# Patient Record
Sex: Female | Born: 1945 | Race: White | Hispanic: No | Marital: Single
Health system: Southern US, Community
[De-identification: ages and names within clinical notes are randomized; demographics above are authoritative.]

---

## 2015-11-06 DIAGNOSIS — R69 Illness, unspecified: Secondary | ICD-10-CM | POA: Diagnosis not present

## 2016-02-05 DIAGNOSIS — E7800 Pure hypercholesterolemia, unspecified: Secondary | ICD-10-CM | POA: Diagnosis not present

## 2016-02-05 DIAGNOSIS — R69 Illness, unspecified: Secondary | ICD-10-CM | POA: Diagnosis not present

## 2016-02-05 DIAGNOSIS — Z0000 Encounter for general adult medical examination without abnormal findings: Secondary | ICD-10-CM | POA: Diagnosis not present

## 2016-02-05 DIAGNOSIS — R17 Unspecified jaundice: Secondary | ICD-10-CM | POA: Diagnosis not present

## 2016-02-05 DIAGNOSIS — Z1389 Encounter for screening for other disorder: Secondary | ICD-10-CM | POA: Diagnosis not present

## 2016-02-12 DIAGNOSIS — H18821 Corneal disorder due to contact lens, right eye: Secondary | ICD-10-CM | POA: Diagnosis not present

## 2016-02-12 DIAGNOSIS — H16041 Marginal corneal ulcer, right eye: Secondary | ICD-10-CM | POA: Diagnosis not present

## 2016-02-13 DIAGNOSIS — R17 Unspecified jaundice: Secondary | ICD-10-CM | POA: Diagnosis not present

## 2016-02-14 DIAGNOSIS — H10413 Chronic giant papillary conjunctivitis, bilateral: Secondary | ICD-10-CM | POA: Diagnosis not present

## 2016-02-14 DIAGNOSIS — H17821 Peripheral opacity of cornea, right eye: Secondary | ICD-10-CM | POA: Diagnosis not present

## 2016-04-06 DIAGNOSIS — H11153 Pinguecula, bilateral: Secondary | ICD-10-CM | POA: Diagnosis not present

## 2016-04-06 DIAGNOSIS — H2513 Age-related nuclear cataract, bilateral: Secondary | ICD-10-CM | POA: Diagnosis not present

## 2016-04-13 DIAGNOSIS — Z1231 Encounter for screening mammogram for malignant neoplasm of breast: Secondary | ICD-10-CM | POA: Diagnosis not present

## 2016-07-29 DIAGNOSIS — D692 Other nonthrombocytopenic purpura: Secondary | ICD-10-CM | POA: Diagnosis not present

## 2016-07-29 DIAGNOSIS — D0462 Carcinoma in situ of skin of left upper limb, including shoulder: Secondary | ICD-10-CM | POA: Diagnosis not present

## 2016-07-29 DIAGNOSIS — Z85828 Personal history of other malignant neoplasm of skin: Secondary | ICD-10-CM | POA: Diagnosis not present

## 2016-07-29 DIAGNOSIS — L738 Other specified follicular disorders: Secondary | ICD-10-CM | POA: Diagnosis not present

## 2016-07-29 DIAGNOSIS — D2239 Melanocytic nevi of other parts of face: Secondary | ICD-10-CM | POA: Diagnosis not present

## 2016-07-29 DIAGNOSIS — L570 Actinic keratosis: Secondary | ICD-10-CM | POA: Diagnosis not present

## 2016-07-29 DIAGNOSIS — L821 Other seborrheic keratosis: Secondary | ICD-10-CM | POA: Diagnosis not present

## 2016-08-05 DIAGNOSIS — D0462 Carcinoma in situ of skin of left upper limb, including shoulder: Secondary | ICD-10-CM | POA: Diagnosis not present

## 2016-08-05 DIAGNOSIS — Z23 Encounter for immunization: Secondary | ICD-10-CM | POA: Diagnosis not present

## 2016-08-05 DIAGNOSIS — Z85828 Personal history of other malignant neoplasm of skin: Secondary | ICD-10-CM | POA: Diagnosis not present

## 2016-11-27 DIAGNOSIS — R509 Fever, unspecified: Secondary | ICD-10-CM | POA: Diagnosis not present

## 2016-11-27 DIAGNOSIS — J101 Influenza due to other identified influenza virus with other respiratory manifestations: Secondary | ICD-10-CM | POA: Diagnosis not present

## 2016-12-14 DIAGNOSIS — R69 Illness, unspecified: Secondary | ICD-10-CM | POA: Diagnosis not present

## 2017-03-28 DIAGNOSIS — Z0100 Encounter for examination of eyes and vision without abnormal findings: Secondary | ICD-10-CM | POA: Diagnosis not present

## 2017-03-31 DIAGNOSIS — Z1231 Encounter for screening mammogram for malignant neoplasm of breast: Secondary | ICD-10-CM | POA: Diagnosis not present

## 2017-04-07 DIAGNOSIS — H11153 Pinguecula, bilateral: Secondary | ICD-10-CM | POA: Diagnosis not present

## 2017-04-07 DIAGNOSIS — H2513 Age-related nuclear cataract, bilateral: Secondary | ICD-10-CM | POA: Diagnosis not present

## 2017-05-11 DIAGNOSIS — Z1389 Encounter for screening for other disorder: Secondary | ICD-10-CM | POA: Diagnosis not present

## 2017-05-11 DIAGNOSIS — M8580 Other specified disorders of bone density and structure, unspecified site: Secondary | ICD-10-CM | POA: Diagnosis not present

## 2017-05-11 DIAGNOSIS — E7800 Pure hypercholesterolemia, unspecified: Secondary | ICD-10-CM | POA: Diagnosis not present

## 2017-05-11 DIAGNOSIS — Z0000 Encounter for general adult medical examination without abnormal findings: Secondary | ICD-10-CM | POA: Diagnosis not present

## 2017-06-16 DIAGNOSIS — L821 Other seborrheic keratosis: Secondary | ICD-10-CM | POA: Diagnosis not present

## 2017-06-16 DIAGNOSIS — L814 Other melanin hyperpigmentation: Secondary | ICD-10-CM | POA: Diagnosis not present

## 2017-06-16 DIAGNOSIS — L738 Other specified follicular disorders: Secondary | ICD-10-CM | POA: Diagnosis not present

## 2017-06-16 DIAGNOSIS — D2239 Melanocytic nevi of other parts of face: Secondary | ICD-10-CM | POA: Diagnosis not present

## 2017-06-16 DIAGNOSIS — D2271 Melanocytic nevi of right lower limb, including hip: Secondary | ICD-10-CM | POA: Diagnosis not present

## 2017-06-16 DIAGNOSIS — D692 Other nonthrombocytopenic purpura: Secondary | ICD-10-CM | POA: Diagnosis not present

## 2017-06-16 DIAGNOSIS — Z85828 Personal history of other malignant neoplasm of skin: Secondary | ICD-10-CM | POA: Diagnosis not present

## 2017-07-15 DIAGNOSIS — M8588 Other specified disorders of bone density and structure, other site: Secondary | ICD-10-CM | POA: Diagnosis not present

## 2017-08-12 DIAGNOSIS — Z23 Encounter for immunization: Secondary | ICD-10-CM | POA: Diagnosis not present

## 2017-12-03 DIAGNOSIS — Z0100 Encounter for examination of eyes and vision without abnormal findings: Secondary | ICD-10-CM | POA: Diagnosis not present

## 2018-02-16 DIAGNOSIS — R69 Illness, unspecified: Secondary | ICD-10-CM | POA: Diagnosis not present

## 2018-03-01 DIAGNOSIS — R69 Illness, unspecified: Secondary | ICD-10-CM | POA: Diagnosis not present

## 2018-04-07 DIAGNOSIS — H17821 Peripheral opacity of cornea, right eye: Secondary | ICD-10-CM | POA: Diagnosis not present

## 2018-04-07 DIAGNOSIS — H35372 Puckering of macula, left eye: Secondary | ICD-10-CM | POA: Diagnosis not present

## 2018-04-07 DIAGNOSIS — Z1231 Encounter for screening mammogram for malignant neoplasm of breast: Secondary | ICD-10-CM | POA: Diagnosis not present

## 2018-04-07 DIAGNOSIS — H2513 Age-related nuclear cataract, bilateral: Secondary | ICD-10-CM | POA: Diagnosis not present

## 2018-06-03 DIAGNOSIS — Z1389 Encounter for screening for other disorder: Secondary | ICD-10-CM | POA: Diagnosis not present

## 2018-06-03 DIAGNOSIS — M8580 Other specified disorders of bone density and structure, unspecified site: Secondary | ICD-10-CM | POA: Diagnosis not present

## 2018-06-03 DIAGNOSIS — E7800 Pure hypercholesterolemia, unspecified: Secondary | ICD-10-CM | POA: Diagnosis not present

## 2018-06-03 DIAGNOSIS — Z0000 Encounter for general adult medical examination without abnormal findings: Secondary | ICD-10-CM | POA: Diagnosis not present

## 2018-07-26 DIAGNOSIS — D1801 Hemangioma of skin and subcutaneous tissue: Secondary | ICD-10-CM | POA: Diagnosis not present

## 2018-07-26 DIAGNOSIS — L570 Actinic keratosis: Secondary | ICD-10-CM | POA: Diagnosis not present

## 2018-07-26 DIAGNOSIS — L821 Other seborrheic keratosis: Secondary | ICD-10-CM | POA: Diagnosis not present

## 2018-07-26 DIAGNOSIS — Z85828 Personal history of other malignant neoplasm of skin: Secondary | ICD-10-CM | POA: Diagnosis not present

## 2018-07-26 DIAGNOSIS — L738 Other specified follicular disorders: Secondary | ICD-10-CM | POA: Diagnosis not present

## 2018-07-28 DIAGNOSIS — Z23 Encounter for immunization: Secondary | ICD-10-CM | POA: Diagnosis not present

## 2018-08-24 DIAGNOSIS — R69 Illness, unspecified: Secondary | ICD-10-CM | POA: Diagnosis not present

## 2018-11-22 DIAGNOSIS — Z0100 Encounter for examination of eyes and vision without abnormal findings: Secondary | ICD-10-CM | POA: Diagnosis not present

## 2019-02-28 DIAGNOSIS — R69 Illness, unspecified: Secondary | ICD-10-CM | POA: Diagnosis not present

## 2019-04-10 DIAGNOSIS — H5203 Hypermetropia, bilateral: Secondary | ICD-10-CM | POA: Diagnosis not present

## 2019-04-10 DIAGNOSIS — H2513 Age-related nuclear cataract, bilateral: Secondary | ICD-10-CM | POA: Diagnosis not present

## 2019-04-10 DIAGNOSIS — H1789 Other corneal scars and opacities: Secondary | ICD-10-CM | POA: Diagnosis not present

## 2019-04-13 DIAGNOSIS — Z1231 Encounter for screening mammogram for malignant neoplasm of breast: Secondary | ICD-10-CM | POA: Diagnosis not present

## 2019-06-13 DIAGNOSIS — M8580 Other specified disorders of bone density and structure, unspecified site: Secondary | ICD-10-CM | POA: Diagnosis not present

## 2019-06-13 DIAGNOSIS — E782 Mixed hyperlipidemia: Secondary | ICD-10-CM | POA: Diagnosis not present

## 2019-06-13 DIAGNOSIS — Z1389 Encounter for screening for other disorder: Secondary | ICD-10-CM | POA: Diagnosis not present

## 2019-06-13 DIAGNOSIS — Z0000 Encounter for general adult medical examination without abnormal findings: Secondary | ICD-10-CM | POA: Diagnosis not present

## 2019-06-28 ENCOUNTER — Other Ambulatory Visit: Payer: Self-pay | Admitting: Internal Medicine

## 2019-06-28 DIAGNOSIS — M8580 Other specified disorders of bone density and structure, unspecified site: Secondary | ICD-10-CM

## 2019-07-26 DIAGNOSIS — Z23 Encounter for immunization: Secondary | ICD-10-CM | POA: Diagnosis not present

## 2019-07-27 ENCOUNTER — Ambulatory Visit
Admission: RE | Admit: 2019-07-27 | Discharge: 2019-07-27 | Disposition: A | Discharge status: Home / Self Care | Payer: Medicare HMO | Source: Ambulatory Visit | Attending: Internal Medicine | Admitting: Internal Medicine

## 2019-07-27 ENCOUNTER — Other Ambulatory Visit: Payer: Self-pay

## 2019-07-27 DIAGNOSIS — M8580 Other specified disorders of bone density and structure, unspecified site: Secondary | ICD-10-CM

## 2019-07-27 DIAGNOSIS — Z780 Asymptomatic menopausal state: Secondary | ICD-10-CM | POA: Diagnosis not present

## 2019-07-27 DIAGNOSIS — M85852 Other specified disorders of bone density and structure, left thigh: Secondary | ICD-10-CM | POA: Diagnosis not present

## 2019-09-25 DIAGNOSIS — R69 Illness, unspecified: Secondary | ICD-10-CM | POA: Diagnosis not present

## 2019-10-03 DIAGNOSIS — L821 Other seborrheic keratosis: Secondary | ICD-10-CM | POA: Diagnosis not present

## 2019-10-03 DIAGNOSIS — L814 Other melanin hyperpigmentation: Secondary | ICD-10-CM | POA: Diagnosis not present

## 2019-10-03 DIAGNOSIS — D2239 Melanocytic nevi of other parts of face: Secondary | ICD-10-CM | POA: Diagnosis not present

## 2019-10-03 DIAGNOSIS — L905 Scar conditions and fibrosis of skin: Secondary | ICD-10-CM | POA: Diagnosis not present

## 2019-10-03 DIAGNOSIS — Z85828 Personal history of other malignant neoplasm of skin: Secondary | ICD-10-CM | POA: Diagnosis not present

## 2019-10-03 DIAGNOSIS — L738 Other specified follicular disorders: Secondary | ICD-10-CM | POA: Diagnosis not present

## 2019-10-03 DIAGNOSIS — L570 Actinic keratosis: Secondary | ICD-10-CM | POA: Diagnosis not present

## 2019-11-04 ENCOUNTER — Ambulatory Visit: Payer: Medicare HMO | Attending: Internal Medicine

## 2019-11-04 NOTE — Progress Notes (Unsigned)
   Covid-19 Vaccination Clinic  Name:  Destiny Duncan    MRN: [id number] DOB: 05/28/1946  11/04/2019  Destiny Duncan was observed post Covid-19 immunization for {COVID Vaccine Observation Times:23551} without incidence. She was provided with Vaccine Information Sheet and instruction to access the V-Safe system.   Destiny Duncan was instructed to call 911 with any severe reactions post vaccine: Marland Kitchen Difficulty breathing  . Swelling of your face and throat  . A fast heartbeat  . A bad rash all over your body  . Dizziness and weakness

## 2019-11-22 ENCOUNTER — Ambulatory Visit: Payer: Medicare HMO | Attending: Internal Medicine

## 2019-11-22 ENCOUNTER — Ambulatory Visit: Payer: Self-pay

## 2019-11-22 DIAGNOSIS — Z23 Encounter for immunization: Secondary | ICD-10-CM | POA: Insufficient documentation

## 2019-11-22 NOTE — Progress Notes (Signed)
   Covid-19 Vaccination Clinic  Name:  Destiny Duncan    MRN: [id number] DOB: 1946-09-19  11/22/2019  Destiny Duncan was observed post Covid-19 immunization for 15 minutes without incidence. She was provided with Vaccine Information Sheet and instruction to access the V-Safe system.   Destiny Duncan was instructed to call 911 with any severe reactions post vaccine: Marland Kitchen Difficulty breathing  . Swelling of your face and throat  . A fast heartbeat  . A bad rash all over your body  . Dizziness and weakness    Immunizations Administered    Name Date Dose VIS Date Route   Pfizer COVID-19 Vaccine 11/22/2019  8:16 AM 0.3 mL 09/29/2019 Intramuscular   Manufacturer: [place]   Lot: [id number]   [place]: [id number]

## 2020-03-22 DIAGNOSIS — K130 Diseases of lips: Secondary | ICD-10-CM | POA: Diagnosis not present

## 2020-03-22 DIAGNOSIS — Z85828 Personal history of other malignant neoplasm of skin: Secondary | ICD-10-CM | POA: Diagnosis not present

## 2020-03-27 DIAGNOSIS — R69 Illness, unspecified: Secondary | ICD-10-CM | POA: Diagnosis not present

## 2020-04-17 DIAGNOSIS — Z1231 Encounter for screening mammogram for malignant neoplasm of breast: Secondary | ICD-10-CM | POA: Diagnosis not present

## 2020-05-03 DIAGNOSIS — L821 Other seborrheic keratosis: Secondary | ICD-10-CM | POA: Diagnosis not present

## 2020-05-03 DIAGNOSIS — Z85828 Personal history of other malignant neoplasm of skin: Secondary | ICD-10-CM | POA: Diagnosis not present

## 2020-05-03 DIAGNOSIS — K130 Diseases of lips: Secondary | ICD-10-CM | POA: Diagnosis not present

## 2020-06-21 DIAGNOSIS — E7800 Pure hypercholesterolemia, unspecified: Secondary | ICD-10-CM | POA: Diagnosis not present

## 2020-06-21 DIAGNOSIS — Z79899 Other long term (current) drug therapy: Secondary | ICD-10-CM | POA: Diagnosis not present

## 2020-06-21 DIAGNOSIS — Z1389 Encounter for screening for other disorder: Secondary | ICD-10-CM | POA: Diagnosis not present

## 2020-06-21 DIAGNOSIS — M8580 Other specified disorders of bone density and structure, unspecified site: Secondary | ICD-10-CM | POA: Diagnosis not present

## 2020-06-21 DIAGNOSIS — Z0000 Encounter for general adult medical examination without abnormal findings: Secondary | ICD-10-CM | POA: Diagnosis not present

## 2020-06-25 DIAGNOSIS — Z0100 Encounter for examination of eyes and vision without abnormal findings: Secondary | ICD-10-CM | POA: Diagnosis not present

## 2020-06-25 DIAGNOSIS — H524 Presbyopia: Secondary | ICD-10-CM | POA: Diagnosis not present

## 2020-06-25 DIAGNOSIS — H2513 Age-related nuclear cataract, bilateral: Secondary | ICD-10-CM | POA: Diagnosis not present

## 2020-07-10 DIAGNOSIS — R69 Illness, unspecified: Secondary | ICD-10-CM | POA: Diagnosis not present

## 2020-07-25 DIAGNOSIS — Z23 Encounter for immunization: Secondary | ICD-10-CM | POA: Diagnosis not present

## 2020-08-31 DIAGNOSIS — R69 Illness, unspecified: Secondary | ICD-10-CM | POA: Diagnosis not present

## 2020-09-30 DIAGNOSIS — R69 Illness, unspecified: Secondary | ICD-10-CM | POA: Diagnosis not present

## 2020-10-22 DIAGNOSIS — L738 Other specified follicular disorders: Secondary | ICD-10-CM | POA: Diagnosis not present

## 2020-10-22 DIAGNOSIS — L821 Other seborrheic keratosis: Secondary | ICD-10-CM | POA: Diagnosis not present

## 2020-10-22 DIAGNOSIS — L820 Inflamed seborrheic keratosis: Secondary | ICD-10-CM | POA: Diagnosis not present

## 2020-10-22 DIAGNOSIS — D1801 Hemangioma of skin and subcutaneous tissue: Secondary | ICD-10-CM | POA: Diagnosis not present

## 2020-10-22 DIAGNOSIS — L570 Actinic keratosis: Secondary | ICD-10-CM | POA: Diagnosis not present

## 2020-10-22 DIAGNOSIS — Z85828 Personal history of other malignant neoplasm of skin: Secondary | ICD-10-CM | POA: Diagnosis not present

## 2021-04-26 DIAGNOSIS — Z1231 Encounter for screening mammogram for malignant neoplasm of breast: Secondary | ICD-10-CM | POA: Diagnosis not present

## 2021-06-26 DIAGNOSIS — H2513 Age-related nuclear cataract, bilateral: Secondary | ICD-10-CM | POA: Diagnosis not present

## 2021-06-26 DIAGNOSIS — H524 Presbyopia: Secondary | ICD-10-CM | POA: Diagnosis not present

## 2021-06-27 DIAGNOSIS — Z0100 Encounter for examination of eyes and vision without abnormal findings: Secondary | ICD-10-CM | POA: Diagnosis not present

## 2021-07-04 DIAGNOSIS — E7800 Pure hypercholesterolemia, unspecified: Secondary | ICD-10-CM | POA: Diagnosis not present

## 2021-07-04 DIAGNOSIS — Z1211 Encounter for screening for malignant neoplasm of colon: Secondary | ICD-10-CM | POA: Diagnosis not present

## 2021-07-04 DIAGNOSIS — Z1389 Encounter for screening for other disorder: Secondary | ICD-10-CM | POA: Diagnosis not present

## 2021-07-04 DIAGNOSIS — M8580 Other specified disorders of bone density and structure, unspecified site: Secondary | ICD-10-CM | POA: Diagnosis not present

## 2021-07-04 DIAGNOSIS — Z0000 Encounter for general adult medical examination without abnormal findings: Secondary | ICD-10-CM | POA: Diagnosis not present

## 2021-07-04 DIAGNOSIS — Z1331 Encounter for screening for depression: Secondary | ICD-10-CM | POA: Diagnosis not present

## 2021-07-07 DIAGNOSIS — Z1211 Encounter for screening for malignant neoplasm of colon: Secondary | ICD-10-CM | POA: Diagnosis not present

## 2021-07-08 ENCOUNTER — Other Ambulatory Visit: Payer: Self-pay | Admitting: Internal Medicine

## 2021-07-08 DIAGNOSIS — M8580 Other specified disorders of bone density and structure, unspecified site: Secondary | ICD-10-CM

## 2021-08-07 DIAGNOSIS — R030 Elevated blood-pressure reading, without diagnosis of hypertension: Secondary | ICD-10-CM | POA: Diagnosis not present

## 2021-08-07 DIAGNOSIS — Z87891 Personal history of nicotine dependence: Secondary | ICD-10-CM | POA: Diagnosis not present

## 2021-08-07 DIAGNOSIS — Z8249 Family history of ischemic heart disease and other diseases of the circulatory system: Secondary | ICD-10-CM | POA: Diagnosis not present

## 2021-08-07 DIAGNOSIS — M1990 Unspecified osteoarthritis, unspecified site: Secondary | ICD-10-CM | POA: Diagnosis not present

## 2021-08-07 DIAGNOSIS — Z818 Family history of other mental and behavioral disorders: Secondary | ICD-10-CM | POA: Diagnosis not present

## 2021-08-07 DIAGNOSIS — R69 Illness, unspecified: Secondary | ICD-10-CM | POA: Diagnosis not present

## 2021-08-07 DIAGNOSIS — Z809 Family history of malignant neoplasm, unspecified: Secondary | ICD-10-CM | POA: Diagnosis not present

## 2021-11-17 DIAGNOSIS — L821 Other seborrheic keratosis: Secondary | ICD-10-CM | POA: Diagnosis not present

## 2021-11-17 DIAGNOSIS — D692 Other nonthrombocytopenic purpura: Secondary | ICD-10-CM | POA: Diagnosis not present

## 2021-11-17 DIAGNOSIS — L570 Actinic keratosis: Secondary | ICD-10-CM | POA: Diagnosis not present

## 2021-11-17 DIAGNOSIS — Z85828 Personal history of other malignant neoplasm of skin: Secondary | ICD-10-CM | POA: Diagnosis not present

## 2021-11-17 DIAGNOSIS — L738 Other specified follicular disorders: Secondary | ICD-10-CM | POA: Diagnosis not present

## 2021-12-19 ENCOUNTER — Other Ambulatory Visit: Payer: Medicare HMO

## 2021-12-26 DIAGNOSIS — H524 Presbyopia: Secondary | ICD-10-CM | POA: Diagnosis not present

## 2021-12-26 DIAGNOSIS — H2513 Age-related nuclear cataract, bilateral: Secondary | ICD-10-CM | POA: Diagnosis not present

## 2022-03-23 DIAGNOSIS — Z87891 Personal history of nicotine dependence: Secondary | ICD-10-CM | POA: Diagnosis not present

## 2022-03-23 DIAGNOSIS — M1990 Unspecified osteoarthritis, unspecified site: Secondary | ICD-10-CM | POA: Diagnosis not present

## 2022-03-23 DIAGNOSIS — R030 Elevated blood-pressure reading, without diagnosis of hypertension: Secondary | ICD-10-CM | POA: Diagnosis not present

## 2022-03-23 DIAGNOSIS — Z85828 Personal history of other malignant neoplasm of skin: Secondary | ICD-10-CM | POA: Diagnosis not present

## 2022-03-23 DIAGNOSIS — Z809 Family history of malignant neoplasm, unspecified: Secondary | ICD-10-CM | POA: Diagnosis not present

## 2022-03-23 DIAGNOSIS — R32 Unspecified urinary incontinence: Secondary | ICD-10-CM | POA: Diagnosis not present

## 2022-03-25 ENCOUNTER — Ambulatory Visit
Admission: RE | Admit: 2022-03-25 | Discharge: 2022-03-25 | Disposition: A | Discharge status: Home / Self Care | Payer: Medicare HMO | Source: Ambulatory Visit | Attending: Internal Medicine | Admitting: Internal Medicine

## 2022-03-25 DIAGNOSIS — Z780 Asymptomatic menopausal state: Secondary | ICD-10-CM | POA: Diagnosis not present

## 2022-03-25 DIAGNOSIS — M810 Age-related osteoporosis without current pathological fracture: Secondary | ICD-10-CM | POA: Diagnosis not present

## 2022-03-25 DIAGNOSIS — M85852 Other specified disorders of bone density and structure, left thigh: Secondary | ICD-10-CM | POA: Diagnosis not present

## 2022-03-25 DIAGNOSIS — M8580 Other specified disorders of bone density and structure, unspecified site: Secondary | ICD-10-CM

## 2022-05-14 DIAGNOSIS — Z1231 Encounter for screening mammogram for malignant neoplasm of breast: Secondary | ICD-10-CM | POA: Diagnosis not present

## 2022-08-03 DIAGNOSIS — H2513 Age-related nuclear cataract, bilateral: Secondary | ICD-10-CM | POA: Diagnosis not present

## 2022-08-03 DIAGNOSIS — H5203 Hypermetropia, bilateral: Secondary | ICD-10-CM | POA: Diagnosis not present

## 2022-08-12 DIAGNOSIS — H269 Unspecified cataract: Secondary | ICD-10-CM | POA: Diagnosis not present

## 2022-08-12 DIAGNOSIS — H2512 Age-related nuclear cataract, left eye: Secondary | ICD-10-CM | POA: Diagnosis not present

## 2022-08-13 DIAGNOSIS — M810 Age-related osteoporosis without current pathological fracture: Secondary | ICD-10-CM | POA: Diagnosis not present

## 2022-08-13 DIAGNOSIS — E7800 Pure hypercholesterolemia, unspecified: Secondary | ICD-10-CM | POA: Diagnosis not present

## 2022-08-13 DIAGNOSIS — Z79899 Other long term (current) drug therapy: Secondary | ICD-10-CM | POA: Diagnosis not present

## 2022-08-13 DIAGNOSIS — Z1331 Encounter for screening for depression: Secondary | ICD-10-CM | POA: Diagnosis not present

## 2022-08-13 DIAGNOSIS — Z5181 Encounter for therapeutic drug level monitoring: Secondary | ICD-10-CM | POA: Diagnosis not present

## 2022-08-13 DIAGNOSIS — Z0000 Encounter for general adult medical examination without abnormal findings: Secondary | ICD-10-CM | POA: Diagnosis not present

## 2022-08-26 DIAGNOSIS — H269 Unspecified cataract: Secondary | ICD-10-CM | POA: Diagnosis not present

## 2022-08-26 DIAGNOSIS — H2511 Age-related nuclear cataract, right eye: Secondary | ICD-10-CM | POA: Diagnosis not present

## 2022-10-21 DIAGNOSIS — R69 Illness, unspecified: Secondary | ICD-10-CM | POA: Diagnosis not present

## 2022-11-10 DIAGNOSIS — R69 Illness, unspecified: Secondary | ICD-10-CM | POA: Diagnosis not present

## 2022-12-04 DIAGNOSIS — R69 Illness, unspecified: Secondary | ICD-10-CM | POA: Diagnosis not present

## 2022-12-08 DIAGNOSIS — R69 Illness, unspecified: Secondary | ICD-10-CM | POA: Diagnosis not present

## 2022-12-29 DIAGNOSIS — R69 Illness, unspecified: Secondary | ICD-10-CM | POA: Diagnosis not present

## 2022-12-30 DIAGNOSIS — L821 Other seborrheic keratosis: Secondary | ICD-10-CM | POA: Diagnosis not present

## 2022-12-30 DIAGNOSIS — R69 Illness, unspecified: Secondary | ICD-10-CM | POA: Diagnosis not present

## 2022-12-30 DIAGNOSIS — L817 Pigmented purpuric dermatosis: Secondary | ICD-10-CM | POA: Diagnosis not present

## 2022-12-30 DIAGNOSIS — D692 Other nonthrombocytopenic purpura: Secondary | ICD-10-CM | POA: Diagnosis not present

## 2022-12-30 DIAGNOSIS — L570 Actinic keratosis: Secondary | ICD-10-CM | POA: Diagnosis not present

## 2022-12-30 DIAGNOSIS — L738 Other specified follicular disorders: Secondary | ICD-10-CM | POA: Diagnosis not present

## 2022-12-30 DIAGNOSIS — D485 Neoplasm of uncertain behavior of skin: Secondary | ICD-10-CM | POA: Diagnosis not present

## 2022-12-30 DIAGNOSIS — Z85828 Personal history of other malignant neoplasm of skin: Secondary | ICD-10-CM | POA: Diagnosis not present

## 2023-01-31 DIAGNOSIS — R69 Illness, unspecified: Secondary | ICD-10-CM | POA: Diagnosis not present

## 2023-03-04 DIAGNOSIS — R69 Illness, unspecified: Secondary | ICD-10-CM | POA: Diagnosis not present

## 2023-03-12 DIAGNOSIS — R69 Illness, unspecified: Secondary | ICD-10-CM | POA: Diagnosis not present

## 2023-03-26 DIAGNOSIS — R69 Illness, unspecified: Secondary | ICD-10-CM | POA: Diagnosis not present

## 2023-04-02 DIAGNOSIS — R69 Illness, unspecified: Secondary | ICD-10-CM | POA: Diagnosis not present

## 2023-04-06 DIAGNOSIS — R69 Illness, unspecified: Secondary | ICD-10-CM | POA: Diagnosis not present

## 2023-04-07 DIAGNOSIS — R69 Illness, unspecified: Secondary | ICD-10-CM | POA: Diagnosis not present

## 2023-04-13 DIAGNOSIS — R69 Illness, unspecified: Secondary | ICD-10-CM | POA: Diagnosis not present

## 2023-04-16 DIAGNOSIS — R69 Illness, unspecified: Secondary | ICD-10-CM | POA: Diagnosis not present

## 2023-04-17 DIAGNOSIS — R69 Illness, unspecified: Secondary | ICD-10-CM | POA: Diagnosis not present

## 2023-04-18 DIAGNOSIS — R69 Illness, unspecified: Secondary | ICD-10-CM | POA: Diagnosis not present

## 2023-05-04 DIAGNOSIS — Z961 Presence of intraocular lens: Secondary | ICD-10-CM | POA: Diagnosis not present

## 2023-05-20 DIAGNOSIS — Z1231 Encounter for screening mammogram for malignant neoplasm of breast: Secondary | ICD-10-CM | POA: Diagnosis not present

## 2023-06-14 DIAGNOSIS — R32 Unspecified urinary incontinence: Secondary | ICD-10-CM | POA: Diagnosis not present

## 2023-06-14 DIAGNOSIS — Z85828 Personal history of other malignant neoplasm of skin: Secondary | ICD-10-CM | POA: Diagnosis not present

## 2023-06-14 DIAGNOSIS — Z809 Family history of malignant neoplasm, unspecified: Secondary | ICD-10-CM | POA: Diagnosis not present

## 2023-06-14 DIAGNOSIS — M810 Age-related osteoporosis without current pathological fracture: Secondary | ICD-10-CM | POA: Diagnosis not present

## 2023-06-14 DIAGNOSIS — Z8249 Family history of ischemic heart disease and other diseases of the circulatory system: Secondary | ICD-10-CM | POA: Diagnosis not present

## 2023-06-14 DIAGNOSIS — Z87891 Personal history of nicotine dependence: Secondary | ICD-10-CM | POA: Diagnosis not present

## 2023-06-14 DIAGNOSIS — Z7983 Long term (current) use of bisphosphonates: Secondary | ICD-10-CM | POA: Diagnosis not present

## 2023-06-14 DIAGNOSIS — M1990 Unspecified osteoarthritis, unspecified site: Secondary | ICD-10-CM | POA: Diagnosis not present

## 2023-06-14 DIAGNOSIS — I10 Essential (primary) hypertension: Secondary | ICD-10-CM | POA: Diagnosis not present

## 2023-06-14 DIAGNOSIS — Z008 Encounter for other general examination: Secondary | ICD-10-CM | POA: Diagnosis not present

## 2023-06-14 DIAGNOSIS — Z66 Do not resuscitate: Secondary | ICD-10-CM | POA: Diagnosis not present

## 2023-06-14 DIAGNOSIS — E785 Hyperlipidemia, unspecified: Secondary | ICD-10-CM | POA: Diagnosis not present

## 2023-06-14 DIAGNOSIS — Z818 Family history of other mental and behavioral disorders: Secondary | ICD-10-CM | POA: Diagnosis not present

## 2023-08-17 DIAGNOSIS — Z5181 Encounter for therapeutic drug level monitoring: Secondary | ICD-10-CM | POA: Diagnosis not present

## 2023-08-17 DIAGNOSIS — Z0000 Encounter for general adult medical examination without abnormal findings: Secondary | ICD-10-CM | POA: Diagnosis not present

## 2023-08-17 DIAGNOSIS — M810 Age-related osteoporosis without current pathological fracture: Secondary | ICD-10-CM | POA: Diagnosis not present

## 2023-08-17 DIAGNOSIS — Z1331 Encounter for screening for depression: Secondary | ICD-10-CM | POA: Diagnosis not present

## 2023-08-17 DIAGNOSIS — E7800 Pure hypercholesterolemia, unspecified: Secondary | ICD-10-CM | POA: Diagnosis not present

## 2023-11-23 DIAGNOSIS — L821 Other seborrheic keratosis: Secondary | ICD-10-CM | POA: Diagnosis not present

## 2023-11-23 DIAGNOSIS — L570 Actinic keratosis: Secondary | ICD-10-CM | POA: Diagnosis not present

## 2023-11-23 DIAGNOSIS — I8312 Varicose veins of left lower extremity with inflammation: Secondary | ICD-10-CM | POA: Diagnosis not present

## 2023-11-23 DIAGNOSIS — I872 Venous insufficiency (chronic) (peripheral): Secondary | ICD-10-CM | POA: Diagnosis not present

## 2023-11-23 DIAGNOSIS — I8311 Varicose veins of right lower extremity with inflammation: Secondary | ICD-10-CM | POA: Diagnosis not present

## 2023-11-23 DIAGNOSIS — L738 Other specified follicular disorders: Secondary | ICD-10-CM | POA: Diagnosis not present

## 2023-11-23 DIAGNOSIS — Z85828 Personal history of other malignant neoplasm of skin: Secondary | ICD-10-CM | POA: Diagnosis not present

## 2023-12-29 DIAGNOSIS — M79673 Pain in unspecified foot: Secondary | ICD-10-CM | POA: Diagnosis not present

## 2024-02-16 DIAGNOSIS — L821 Other seborrheic keratosis: Secondary | ICD-10-CM | POA: Diagnosis not present

## 2024-02-16 DIAGNOSIS — Z85828 Personal history of other malignant neoplasm of skin: Secondary | ICD-10-CM | POA: Diagnosis not present

## 2024-02-16 DIAGNOSIS — I872 Venous insufficiency (chronic) (peripheral): Secondary | ICD-10-CM | POA: Diagnosis not present

## 2024-02-16 DIAGNOSIS — L820 Inflamed seborrheic keratosis: Secondary | ICD-10-CM | POA: Diagnosis not present

## 2024-02-16 DIAGNOSIS — D485 Neoplasm of uncertain behavior of skin: Secondary | ICD-10-CM | POA: Diagnosis not present

## 2024-02-16 DIAGNOSIS — I8311 Varicose veins of right lower extremity with inflammation: Secondary | ICD-10-CM | POA: Diagnosis not present

## 2024-02-16 DIAGNOSIS — I8312 Varicose veins of left lower extremity with inflammation: Secondary | ICD-10-CM | POA: Diagnosis not present

## 2024-02-16 DIAGNOSIS — C44722 Squamous cell carcinoma of skin of right lower limb, including hip: Secondary | ICD-10-CM | POA: Diagnosis not present

## 2024-03-14 DIAGNOSIS — H0015 Chalazion left lower eyelid: Secondary | ICD-10-CM | POA: Diagnosis not present

## 2024-03-14 DIAGNOSIS — H0012 Chalazion right lower eyelid: Secondary | ICD-10-CM | POA: Diagnosis not present

## 2024-04-17 DIAGNOSIS — E7800 Pure hypercholesterolemia, unspecified: Secondary | ICD-10-CM | POA: Diagnosis not present

## 2024-04-17 DIAGNOSIS — M810 Age-related osteoporosis without current pathological fracture: Secondary | ICD-10-CM | POA: Diagnosis not present

## 2024-05-18 DIAGNOSIS — E7800 Pure hypercholesterolemia, unspecified: Secondary | ICD-10-CM | POA: Diagnosis not present

## 2024-05-18 DIAGNOSIS — M810 Age-related osteoporosis without current pathological fracture: Secondary | ICD-10-CM | POA: Diagnosis not present

## 2024-05-25 DIAGNOSIS — Z1231 Encounter for screening mammogram for malignant neoplasm of breast: Secondary | ICD-10-CM | POA: Diagnosis not present

## 2024-06-15 DIAGNOSIS — H1789 Other corneal scars and opacities: Secondary | ICD-10-CM | POA: Diagnosis not present

## 2024-06-15 DIAGNOSIS — Z961 Presence of intraocular lens: Secondary | ICD-10-CM | POA: Diagnosis not present

## 2024-06-15 DIAGNOSIS — H04123 Dry eye syndrome of bilateral lacrimal glands: Secondary | ICD-10-CM | POA: Diagnosis not present

## 2024-08-21 DIAGNOSIS — Z23 Encounter for immunization: Secondary | ICD-10-CM | POA: Diagnosis not present

## 2024-08-21 DIAGNOSIS — Z5181 Encounter for therapeutic drug level monitoring: Secondary | ICD-10-CM | POA: Diagnosis not present

## 2024-08-21 DIAGNOSIS — Z0000 Encounter for general adult medical examination without abnormal findings: Secondary | ICD-10-CM | POA: Diagnosis not present

## 2024-08-21 DIAGNOSIS — E7800 Pure hypercholesterolemia, unspecified: Secondary | ICD-10-CM | POA: Diagnosis not present

## 2024-08-21 DIAGNOSIS — Z1331 Encounter for screening for depression: Secondary | ICD-10-CM | POA: Diagnosis not present

## 2024-10-03 DIAGNOSIS — M85852 Other specified disorders of bone density and structure, left thigh: Secondary | ICD-10-CM | POA: Diagnosis not present

## 2024-10-03 DIAGNOSIS — M85851 Other specified disorders of bone density and structure, right thigh: Secondary | ICD-10-CM | POA: Diagnosis not present

## 2024-10-11 DIAGNOSIS — R059 Cough, unspecified: Secondary | ICD-10-CM | POA: Diagnosis not present

## 2024-10-11 DIAGNOSIS — J069 Acute upper respiratory infection, unspecified: Secondary | ICD-10-CM | POA: Diagnosis not present

## 2024-10-11 DIAGNOSIS — R509 Fever, unspecified: Secondary | ICD-10-CM | POA: Diagnosis not present
